# Patient Record
Sex: Male | Born: 1990 | Hispanic: No | Marital: Single | Smoking: Never smoker
Health system: Southern US, Community
[De-identification: ages and names within clinical notes are randomized; demographics above are authoritative.]

## PROBLEM LIST (undated history)

## (undated) DIAGNOSIS — F4310 Post-traumatic stress disorder, unspecified: Secondary | ICD-10-CM

---
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
## 2008-01-02 ENCOUNTER — Ambulatory Visit: Payer: Self-pay | Admitting: Family Medicine

## 2008-01-02 DIAGNOSIS — K219 Gastro-esophageal reflux disease without esophagitis: Secondary | ICD-10-CM

## 2009-02-01 ENCOUNTER — Emergency Department (HOSPITAL_COMMUNITY): Admission: EM | Admit: 2009-02-01 | Discharge: 2009-02-02 | Payer: Self-pay | Admitting: Emergency Medicine

## 2010-02-23 ENCOUNTER — Emergency Department (HOSPITAL_COMMUNITY): Admission: EM | Admit: 2010-02-23 | Discharge: 2010-02-23 | Payer: Self-pay | Admitting: Emergency Medicine

## 2010-02-24 ENCOUNTER — Ambulatory Visit: Payer: Self-pay | Admitting: Psychiatry

## 2010-02-24 ENCOUNTER — Inpatient Hospital Stay (HOSPITAL_COMMUNITY): Admission: AD | Admit: 2010-02-24 | Discharge: 2010-03-09 | Payer: Self-pay | Admitting: Psychiatry

## 2011-01-21 NOTE — Letter (Signed)
Summary: WELLNESS ASSESSMENT  WELLNESS ASSESSMENT   Imported By: Arta Bruce 11/19/2010 16:50:18  _____________________________________________________________________  External Attachment:    Type:   Image     Comment:   External Document

## 2011-03-14 LAB — DIFFERENTIAL
Basophils Relative: 1 % (ref 0–1)
Eosinophils Absolute: 0.1 10*3/uL (ref 0.0–0.7)
Eosinophils Relative: 1 % (ref 0–5)
Lymphs Abs: 1.4 10*3/uL (ref 0.7–4.0)
Monocytes Absolute: 0.5 10*3/uL (ref 0.1–1.0)
Monocytes Relative: 7 % (ref 3–12)
Neutrophils Relative %: 71 % (ref 43–77)

## 2011-03-14 LAB — COMPREHENSIVE METABOLIC PANEL
ALT: 47 U/L (ref 0–53)
Albumin: 5 g/dL (ref 3.5–5.2)
BUN: 19 mg/dL (ref 6–23)
CO2: 26 mEq/L (ref 19–32)
Calcium: 9.9 mg/dL (ref 8.4–10.5)
Chloride: 104 mEq/L (ref 96–112)
GFR calc non Af Amer: >60 mL/min (ref >60)
Glucose, Bld: 103 mg/dL — ABNORMAL HIGH (ref 70–99)
Potassium: 4.5 mEq/L (ref 3.5–5.1)
Total Protein: 8.4 g/dL — ABNORMAL HIGH (ref 6.0–8.3)

## 2011-03-14 LAB — CBC
Hemoglobin: 16 g/dL (ref 13.0–17.0)
RBC: 4.77 MIL/uL (ref 4.22–5.81)
WBC: 7.2 10*3/uL (ref 4.0–10.5)

## 2011-03-14 LAB — TSH: TSH: 0.499 u[IU]/mL — ABNORMAL LOW (ref 0.700–6.400)

## 2011-03-14 LAB — RAPID URINE DRUG SCREEN, HOSP PERFORMED
Benzodiazepines: NOT DETECTED
Tetrahydrocannabinol: NOT DETECTED

## 2011-04-05 IMAGING — CT CT HEAD W/O CM
1 of 2 series · 13 of 30 positions shown, 17 images · non-contrast
Comparison: 02/10/2006.

CLINICAL DATA: A LOC

CT HEAD WITHOUT CONTRAST
TECHNIQUE: Contiguous axial images were obtained from the base of
the skull through the vertex without contrast.

[Series 2: brain · axial · 0.47mm/px · z∈[+164,+289]mm · 13 of 28 slices shown, 17 images]
[im 2/28  brain]
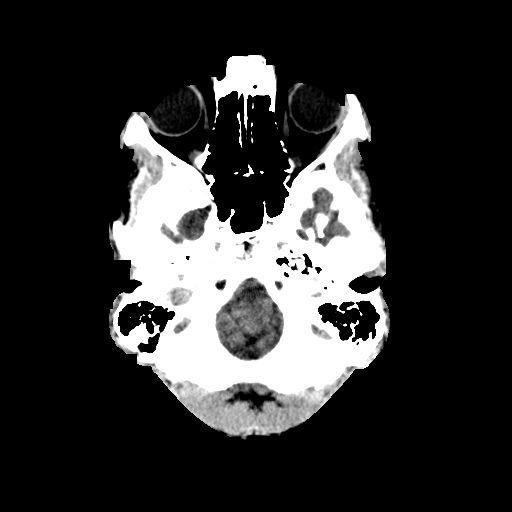
[im 2/28  bone]
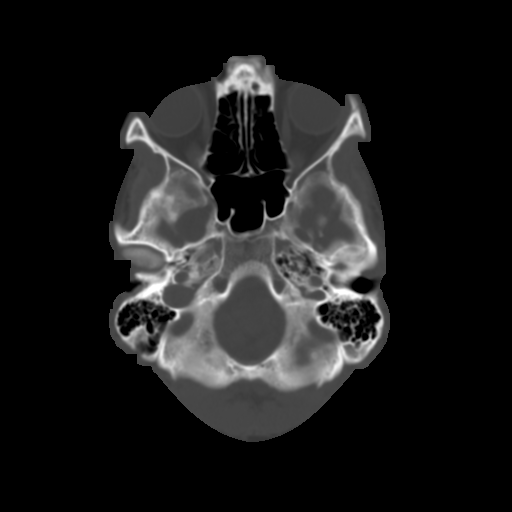
[im 4/28  brain]
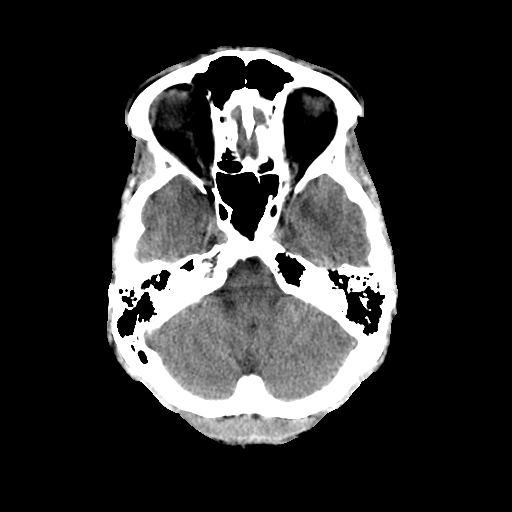
[im 6/28  brain]
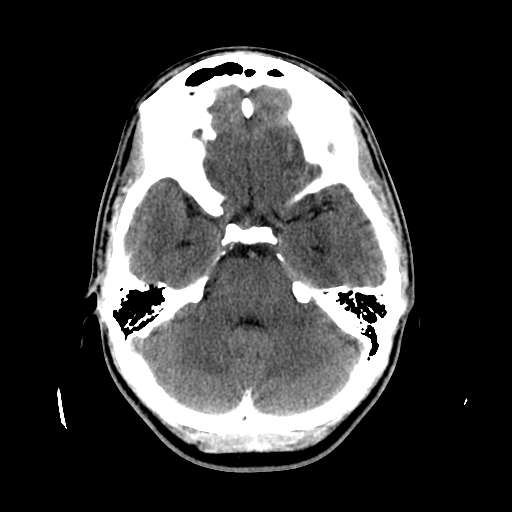
[im 8/28  brain]
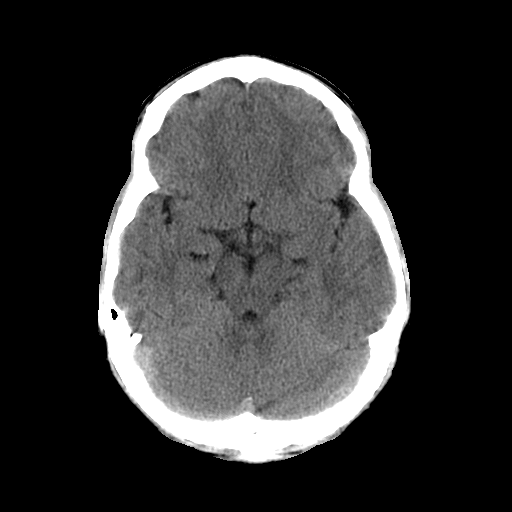
[im 10/28  brain]
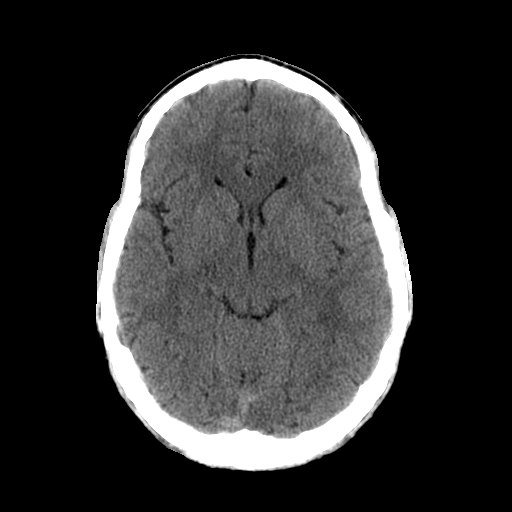
[im 10/28  bone]
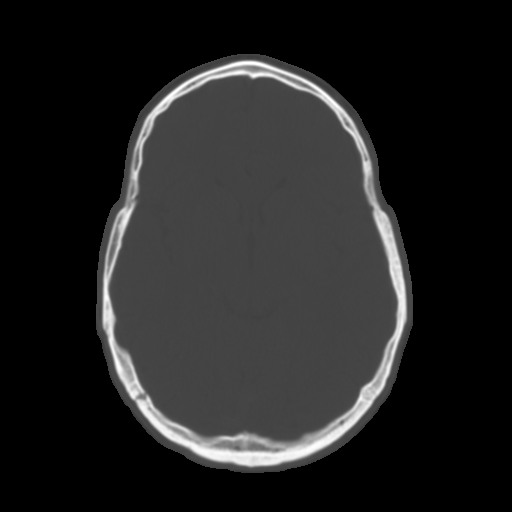
[im 12/28  brain]
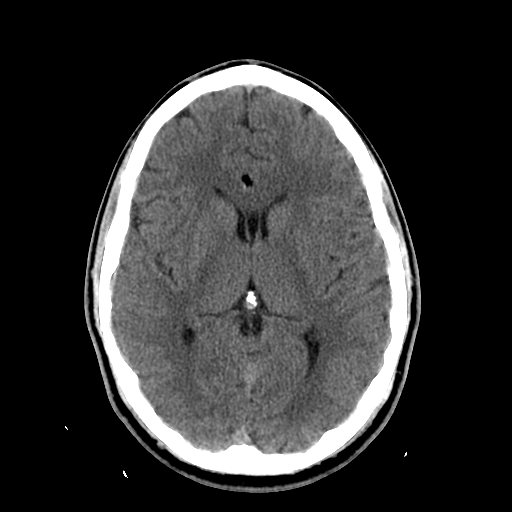
[im 14/28  brain]
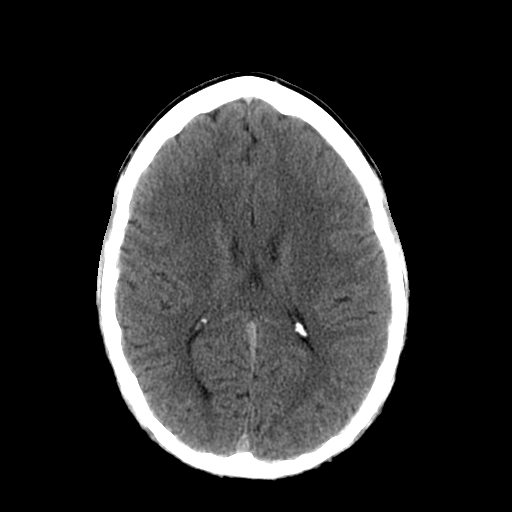
[im 16/28  brain]
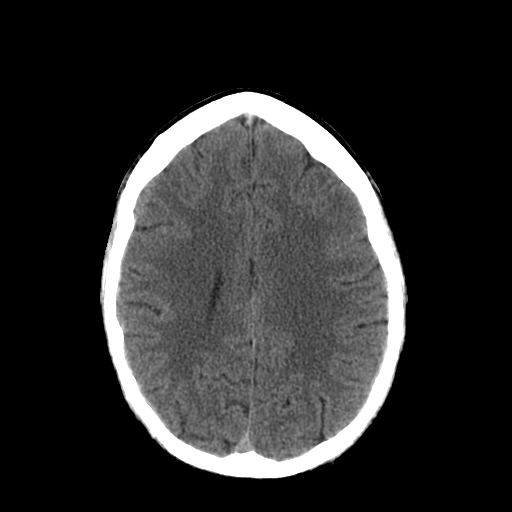
[im 18/28  brain]
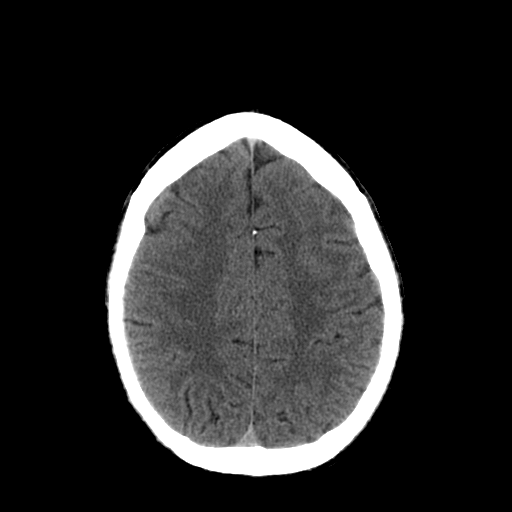
[im 18/28  bone]
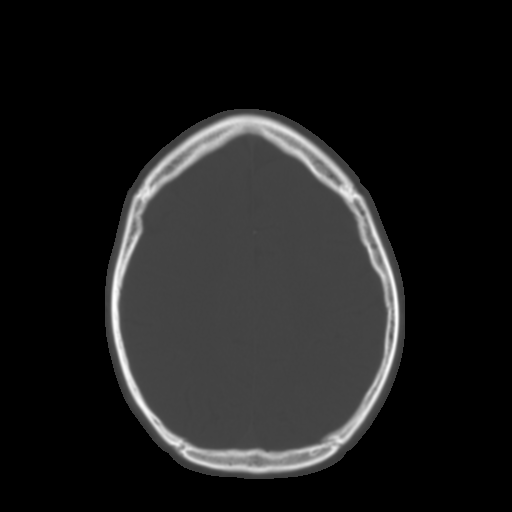
[im 20/28  brain]
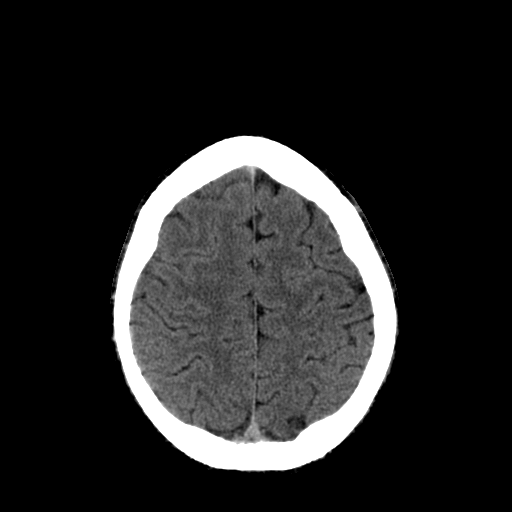
[im 22/28  brain]
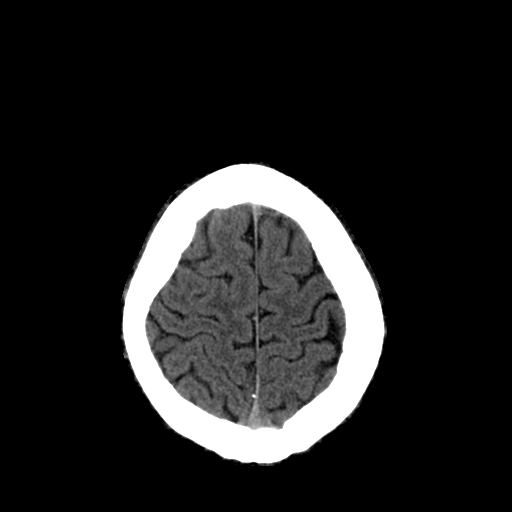
[im 24/28  brain]
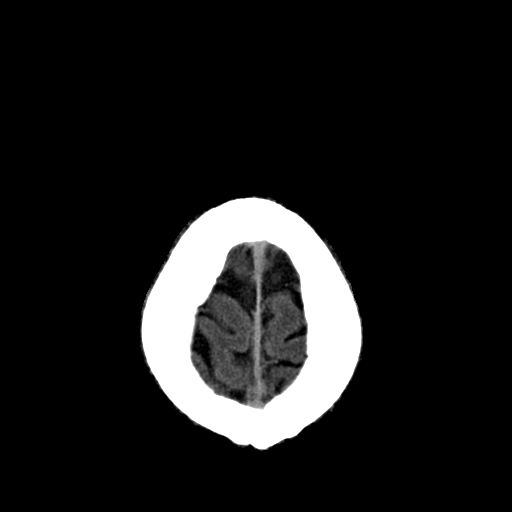
[im 26/28  brain]
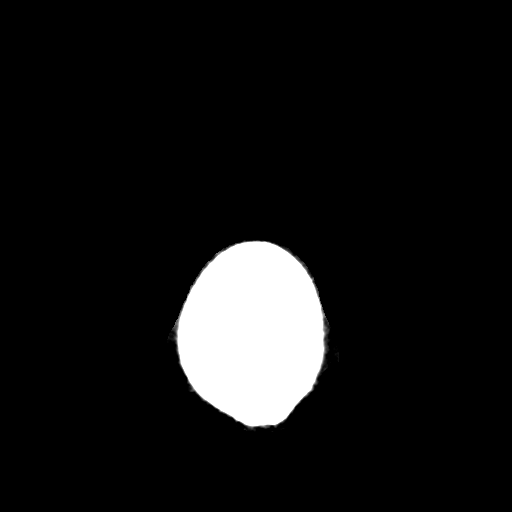
[im 26/28  bone]
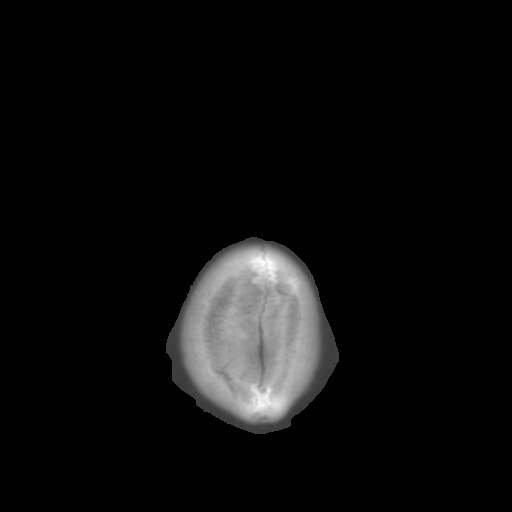

[13 of 30 positions shown; findings below may reference images not displayed]

FINDINGS: Small anterior interhemispheric lipoma is again noted.
This may not be a clinically significant finding.

No acute findings.  Negative for hemorrhage, acute infarction, or
mass lesion.  Bony calvarium intact.  Paranasal and mastoid sinuses
are aerated.
IMPRESSION: No acute intracranial abnormality.  Stable small interhemispheric
lipoma.

## 2011-10-04 LAB — POCT RAPID STREP A: Streptococcus, Group A Screen (Direct): NEGATIVE

## 2012-09-23 ENCOUNTER — Telehealth (HOSPITAL_COMMUNITY): Payer: Self-pay | Admitting: Emergency Medicine

## 2012-09-23 ENCOUNTER — Emergency Department (INDEPENDENT_AMBULATORY_CARE_PROVIDER_SITE_OTHER)
Admission: EM | Admit: 2012-09-23 | Discharge: 2012-09-23 | Disposition: A | Discharge status: Home / Self Care | Payer: Self-pay | Source: Home / Self Care | Attending: Emergency Medicine | Admitting: Emergency Medicine

## 2012-09-23 ENCOUNTER — Encounter (HOSPITAL_COMMUNITY): Payer: Self-pay | Admitting: Emergency Medicine

## 2012-09-23 DIAGNOSIS — K645 Perianal venous thrombosis: Secondary | ICD-10-CM

## 2012-09-23 MED ORDER — LIDOCAINE-HYDROCORTISONE ACE 3-1 % EX PADS: MEDICATED_PAD | CUTANEOUS | Status: AC

## 2012-09-23 MED ORDER — LIDOCAINE-HYDROCORTISONE ACE 3-1 % EX PADS: MEDICATED_PAD | CUTANEOUS | Status: DC

## 2012-09-23 NOTE — ED Provider Notes (Signed)
History     CSN: [id number]  Arrival date & time 09/23/12  1055   First MD Initiated Contact with Patient 09/23/12 1144      Chief Complaint  Patient presents with  . Back Pain  . Hemorrhoids    (Consider location/radiation/quality/duration/timing/severity/associated sxs/prior treatment) HPI Comments: For about 2 days patient has been experiencing pain in his rectal area has not seen any blood or discharge. 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 He feels a little bump in his rectal area that is tender at touch. Denies any bleeding, fevers chills or abdominal pain he denies having had any hemorrhoids in the past. He does describe that recently he had been in another clinic will he had multiple blood in a rectal swab test screening him for different sexually transmitted illnesses he describes. Patient denies that he was symptomatic at that time but wanted to be screened for many things.? Patient denies any dysphoria, paresthesias or weakness of his lower extremities  Patient is a 21 y.o. male presenting with back pain. The history is provided by the patient.  Back Pain  This is a new problem. The current episode started 2 days ago. The problem occurs constantly. The problem has been gradually worsening. The pain is mild. Pertinent negatives include no fever, no numbness, no abdominal pain, no dysuria, no paresthesias, no tingling and no weakness. Treatments tried: Used some bath soaks and medicated wipes.    History reviewed. No pertinent past medical history.  History reviewed. No pertinent past surgical history.  History reviewed. No pertinent family history.  History  Substance Use Topics  . Smoking status: Never Smoker   . Smokeless tobacco: Not on file  . Alcohol Use: No      Review of Systems  Constitutional: Negative for fever, chills, diaphoresis, activity change and appetite change.  Gastrointestinal:  Positive for rectal pain. Negative for nausea, vomiting, abdominal pain, diarrhea, constipation, blood in stool, abdominal distention and anal bleeding.  Genitourinary: Negative for dysuria, frequency and flank pain.  Musculoskeletal: Positive for back pain. Negative for joint swelling.  Skin: Negative for rash and wound.  Neurological: Negative for tingling, weakness, numbness and paresthesias.    Allergies  Review of patient's allergies indicates no known allergies.  Home Medications   Current Outpatient Rx  Name Route Sig Dispense Refill  . LIDOCAINE-HYDROCORTISONE ACE 3-1 % EX PADS  A[pply pads twice a day 30 each 0    BP 145/84  Pulse 70  Temp 99.2 F (37.3 C) (Oral)  Resp 17  SpO2 100%  Physical Exam  Nursing note and vitals reviewed. Constitutional: He appears well-developed and well-nourished.  Abdominal: Soft. Bowel sounds are normal.  Genitourinary: Penis normal. Rectal exam shows external hemorrhoid.     Neurological: He is alert.  Skin: No rash noted. No erythema.   Procedure note: Patient was anesthetized using lidocaine with epinephrine 2% about 4 cc were injected into the thrombosed external hemorrhoids after that area was explored prior to him cross-section incision and removal of blood clot. Post removal no immediate bleeding is noted. ED Course  Procedures (including critical care time)  Labs Reviewed - No data to display No results found.   1. Thrombosed external hemorrhoids       MDM  Thrombosed external hemorrhoid. Thrombosed was excised and removed. Patient prescribed lidocaine with hydrocortisone topical call we have provided a work note to be off his job for  5 days. We discuss symptoms that should warrant his return.        Jimmie Molly, MD 09/23/12 1539

## 2012-09-23 NOTE — ED Notes (Signed)
Pt c/o hem being inflamed and lower back pain since 09/20/2012. Pt states that he does a lot of heavy lifting at work. Pt has tried bath soaks and medicated wipes with no relief of pain.  Pt states that he is unable to eat because of the pain with using the restroom. Pt denies bleeding.

## 2012-09-23 NOTE — ED Notes (Signed)
Pharmacy called stating that they did not have the RX as written.  They stated they had the 3-.5% in a kit.  Dr Ladon Applebaum reviewed chart and stated it could be changed and directions should remain the same.  Shiny at [facility] pharmacy notified

## 2012-09-25 ENCOUNTER — Telehealth (HOSPITAL_COMMUNITY): Payer: Self-pay | Admitting: *Deleted

## 2012-09-25 NOTE — ED Notes (Signed)
Pt. Came to [facility] saying he needed paperwork filled out. I talked to pt.'s supervisor Jethro Bastos, from the [employer] in [place].  She said he needs paperwork filled out for the restrictions at work. She will fax them. I gave her the fax number  Fax received. Pt. Asked to fill out his portion. Unable to find paperwork after pt. left. I called but his phone number was not in service. I called Mom and left a message.  Pt.'s Mom called back. I told her I needed to talk to her son.  His phone number did not work. She gave me [phone] as his new number.  He was there with her. He said he took his papers home with him but will bring them back in the morning. Vassie Moselle 09/25/2012

## 2012-10-09 ENCOUNTER — Telehealth (HOSPITAL_COMMUNITY): Payer: Self-pay | Admitting: *Deleted

## 2012-10-09 NOTE — ED Notes (Signed)
Pt. called and said he needs a note to return to work, with the restrictions lifted. Discussed with Dr. Ladon Applebaum and he said if pt. has rectal pain or bleeding he needs to come back and be rechecked. If not, I can do the note for him to return to work. I  told him what the doctor said.  He said he does not have those symptoms.  I told him I would do the note and leave it at the front desk. He asked if I could fax it and I said I could not. He said he would come to pick it up. Note done and put at the front desk Ricardo Mann 10/09/2012

## 2012-10-21 ENCOUNTER — Emergency Department (HOSPITAL_COMMUNITY)
Admission: EM | Admit: 2012-10-21 | Discharge: 2012-10-23 | Disposition: A | Discharge status: Other Acute Inpatient Hospital | Payer: Self-pay | Attending: Emergency Medicine | Admitting: Emergency Medicine

## 2012-10-21 ENCOUNTER — Encounter (HOSPITAL_COMMUNITY): Payer: Self-pay | Admitting: Emergency Medicine

## 2012-10-21 DIAGNOSIS — F4310 Post-traumatic stress disorder, unspecified: Secondary | ICD-10-CM | POA: Insufficient documentation

## 2012-10-21 DIAGNOSIS — F29 Unspecified psychosis not due to a substance or known physiological condition: Secondary | ICD-10-CM | POA: Insufficient documentation

## 2012-10-21 HISTORY — DX: Post-traumatic stress disorder, unspecified: F43.10

## 2012-10-21 LAB — ACETAMINOPHEN LEVEL: Acetaminophen (Tylenol), Serum: <15 ug/mL (ref 10–30)

## 2012-10-21 LAB — SALICYLATE LEVEL: Salicylate Lvl: <2 mg/dL — ABNORMAL LOW (ref 2.8–20.0)

## 2012-10-21 LAB — COMPREHENSIVE METABOLIC PANEL
ALT: 19 U/L (ref 0–53)
Alkaline Phosphatase: 55 U/L (ref 39–117)
CO2: 26 mEq/L (ref 19–32)
Calcium: 9.1 mg/dL (ref 8.4–10.5)
Chloride: 101 mEq/L (ref 96–112)
Creatinine, Ser: 0.71 mg/dL (ref 0.50–1.35)
GFR calc Af Amer: >90 mL/min (ref >90)
GFR calc non Af Amer: >90 mL/min (ref >90)
Glucose, Bld: 102 mg/dL — ABNORMAL HIGH (ref 70–99)

## 2012-10-21 LAB — RAPID URINE DRUG SCREEN, HOSP PERFORMED
Barbiturates: NOT DETECTED
Cocaine: NOT DETECTED

## 2012-10-21 LAB — CBC
Hemoglobin: 14.9 g/dL (ref 13.0–17.0)
RDW: 12.3 % (ref 11.5–15.5)
WBC: 8.2 10*3/uL (ref 4.0–10.5)

## 2012-10-21 LAB — ETHANOL: Alcohol, Ethyl (B): <11 mg/dL (ref 0–11)

## 2012-10-21 MED ORDER — DIPHENHYDRAMINE HCL 25 MG PO CAPS: 50.0000 mg | ORAL_CAPSULE | Freq: Every evening | ORAL | Status: DC | PRN

## 2012-10-21 MED ORDER — DIVALPROEX SODIUM 500 MG PO DR TAB
500.0000 mg | DELAYED_RELEASE_TABLET | Freq: Two times a day (BID) | ORAL | Status: DC
  Administered 2012-10-22 – 2012-10-23 (×4): 500 mg via ORAL
  Filled 2012-10-21 (×4): qty 1

## 2012-10-21 MED ORDER — BENZTROPINE MESYLATE 1 MG/ML IJ SOLN: 2.0000 mg | Freq: Two times a day (BID) | INTRAMUSCULAR | Status: DC | PRN

## 2012-10-21 MED ORDER — HALOPERIDOL 5 MG PO TABS
5.0000 mg | ORAL_TABLET | Freq: Two times a day (BID) | ORAL | Status: DC
  Administered 2012-10-22 – 2012-10-23 (×3): 5 mg via ORAL
  Filled 2012-10-21 (×3): qty 1

## 2012-10-21 MED ORDER — HALOPERIDOL LACTATE 5 MG/ML IJ SOLN: 5.0000 mg | Freq: Three times a day (TID) | INTRAMUSCULAR | Status: DC | PRN

## 2012-10-21 MED ORDER — LORAZEPAM 2 MG/ML IJ SOLN: 2.0000 mg | Freq: Three times a day (TID) | INTRAMUSCULAR | Status: DC | PRN

## 2012-10-21 MED ORDER — LORAZEPAM 1 MG PO TABS
2.0000 mg | ORAL_TABLET | Freq: Once | ORAL | Status: AC
  Administered 2012-10-21: 2 mg via ORAL
  Filled 2012-10-21: qty 2

## 2012-10-21 MED ORDER — CLONAZEPAM 0.5 MG PO TABS
2.0000 mg | ORAL_TABLET | Freq: Two times a day (BID) | ORAL | Status: DC
  Administered 2012-10-22 – 2012-10-23 (×3): 2 mg via ORAL
  Filled 2012-10-21 (×2): qty 2
  Filled 2012-10-21: qty 1
  Filled 2012-10-21 (×2): qty 2

## 2012-10-21 MED ORDER — WHITE PETROLATUM GEL
Status: AC
  Administered 2012-10-21: 1
  Filled 2012-10-21: qty 5

## 2012-10-21 MED ORDER — HALOPERIDOL LACTATE 5 MG/ML IJ SOLN
10.0000 mg | Freq: Once | INTRAMUSCULAR | Status: AC
  Administered 2012-10-21: 10 mg via INTRAMUSCULAR
  Filled 2012-10-21: qty 2

## 2012-10-21 NOTE — ED Notes (Signed)
Ricardo Mann (Pt's mother) [phone]

## 2012-10-21 NOTE — ED Notes (Signed)
Pt sister Lennex Rohm can be reached at [phone] for any questions.

## 2012-10-21 NOTE — ED Notes (Signed)
Pt given sandwich, chips and drinks

## 2012-10-21 NOTE — ED Notes (Signed)
Per EMS: Pt from home.  Family called.  Pt not acting himself.  Pt having flight of ideas and is talking about mariah carey, colors, water, climbing trees, etc.  Hx of PTSD.  Unknown from what.  Family called this a mental breakdown.

## 2012-10-21 NOTE — ED Notes (Signed)
Pt placed in room 43 and placed in blue scrubs.  Pt's clothing removed from room and wanded by security.  Noted one t shirt, one pair of pants, one set of underwear, one pair of shoes.  4 earrings placed in biohazard bag and in pt's belongings bag.  Pt's belongings placed in locker #43.

## 2012-10-21 NOTE — ED Notes (Signed)
Pt's sister states that he rode the bus from [place] to [place] unannounced and arrived at sister's house at 0600 on Friday.  She told him to go back home.  When she went to see him today, he had had a "mental breakdown".  Pt is talking continuously.  None of what he is saying makes sense.  Tangential thoughts.

## 2012-10-21 NOTE — ED Notes (Signed)
Up in room, pt pulled code blue button and was instructed not to pull it-pt verbalized understanding, but had pulled it to have the TV fixed.  TV adjusted, pt pleasant, but rambling, requesting a finger nail file, down loads of albums, see thru under wear.

## 2012-10-21 NOTE — ED Notes (Signed)
Dr bednar into see 

## 2012-10-22 NOTE — ED Notes (Signed)
Pt requested that meds be crushed and put in applesauce.

## 2012-10-22 NOTE — ED Notes (Signed)
Pt was changing positions but still sleeping.

## 2012-10-22 NOTE — ED Notes (Signed)
Patient currently asleep; no s/s of distress noted. Respirations regular and unlabored. Q 15 minute rounds maintained for safety.

## 2012-10-22 NOTE — ED Provider Notes (Signed)
History     CSN: [id number]  Arrival date & time 10/21/12  1443   First MD Initiated Contact with Patient 10/21/12 1530      Chief Complaint  Patient presents with  . Psychiatric Evaluation    (Consider location/radiation/quality/duration/timing/severity/associated sxs/prior treatment) HPI 21yo male, PMH psychosis, presents with anxiety, flight of ideas, pressured speech, nonsensical answers to many questions, unknown duration, homeless, no meds, no doctor, no treatment PTA, denies SI/HI/hallucinations. Past Medical History  Diagnosis Date  . PTSD (post-traumatic stress disorder)     No past surgical history on file.  No family history on file.  History  Substance Use Topics  . Smoking status: Never Smoker   . Smokeless tobacco: Not on file  . Alcohol Use: No      Review of Systems  Unable to perform ROS: Mental status change    Allergies  Review of patient's allergies indicates no known allergies.  Home Medications   Current Outpatient Rx  Name  Route  Sig  Dispense  Refill  . LIDOCAINE-HYDROCORTISONE ACE 3-1 % EX PADS      A[pply pads twice a day   30 each   0     BP 116/67  Pulse 83  Temp 97.9 F (36.6 C) (Oral)  Resp 20  SpO2 98%  Physical Exam  Nursing note and vitals reviewed. Constitutional: He is oriented to person, place, and time.       Awake, alert, nontoxic appearance with baseline speech for patient.  HENT:  Head: Atraumatic.  Mouth/Throat: No oropharyngeal exudate.  Eyes: EOM are normal. Pupils are equal, round, and reactive to light. Right eye exhibits no discharge. Left eye exhibits no discharge.  Neck: Neck supple.  Cardiovascular: Normal rate and regular rhythm.   No murmur heard. Pulmonary/Chest: Effort normal and breath sounds normal. No stridor. No respiratory distress. He has no wheezes. He has no rales. He exhibits no tenderness.  Abdominal: Soft. Bowel sounds are normal. He exhibits no mass. There is no tenderness. There  is no rebound.  Musculoskeletal: He exhibits no tenderness.       Baseline ROM, moves extremities with no obvious new focal weakness.  Lymphadenopathy:    He has no cervical adenopathy.  Neurological: He is alert and oriented to person, place, and time.       Awake, alert, cooperative and aware of situation; motor strength bilaterally; sensation normal to light touch bilaterally; peripheral visual fields full to confrontation; no facial asymmetry; tongue midline; major cranial nerves appear intact; no pronator drift, normal finger to nose bilaterally, baseline gait without new ataxia.  Skin: No rash noted.  Psychiatric:       Anxious, agitated, pressured speech, flight of ideas, lacks insight/judgment    ED Course  Procedures (including critical care time) Made IVC; Telepsych agrees.  Pt became agitated with uncontrolled behavior in ED required emergent treatment of his psychosis.  CRITICAL CARE Performed by: Hurman Horn   Total critical care time:  Critical care time was exclusive of separately billable procedures and treating other patients.  Critical care was necessary to treat or prevent imminent or life-threatening deterioration.  Critical care was time spent personally by me on the following activities: development of treatment plan with patient and/or surrogate as well as nursing, discussions with consultants, evaluation of patient's response to treatment, examination of patient, obtaining history from patient or surrogate, ordering and performing treatments and interventions, ordering and review of laboratory studies, ordering and review of radiographic studies, pulse  oximetry and re-evaluation of patient's condition. Labs Reviewed  COMPREHENSIVE METABOLIC PANEL - Abnormal; Notable for the following:    Glucose, Bld 102 (*)     All other components within normal limits  SALICYLATE LEVEL - Abnormal; Notable for the following:    Salicylate Lvl <2.0 (*)     All other  components within normal limits  CBC  ETHANOL  URINE RAPID DRUG SCREEN (HOSP PERFORMED)  ACETAMINOPHEN LEVEL   No results found.   1. Psychosis       MDM  Dispo pnd.        Hurman Horn, MD 10/22/12 1710
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
## 2012-10-22 NOTE — BHH Counselor (Signed)
Patient has been accepted at [facility] pending bed availability by Jorje Guild PA.

## 2012-10-22 NOTE — ED Notes (Signed)
Pt extremely manic, orders received for IM Haldol, security guards and [employer] in room for assistance.  Gave Haldol and pt took po ativan. Will continue to monitor.  Barnett Hatter P

## 2012-10-23 LAB — VALPROIC ACID LEVEL: Valproic Acid Lvl: 66.5 ug/mL (ref 50.0–100.0)

## 2012-10-23 NOTE — Progress Notes (Signed)
Pt speaking with CM on various topics no aggressive behavior Pleasant cordial

## 2012-10-23 NOTE — Progress Notes (Signed)
Sitting with pt Completed meal

## 2012-10-23 NOTE — ED Notes (Signed)
Pt. Moved to TCU room 29.  Pt. Calm, no distress noted.

## 2012-10-23 NOTE — ED Notes (Signed)
Resting with eyes closed.  Sitter at door.

## 2012-10-23 NOTE — BHH Counselor (Signed)
Pt also referred to [facility] and accepted by Dr. Jeannine Kitten. Nurse will need to call report to [phone]. Pt placed under IVC and [employer] will need to transport patient to [facility].

## 2012-10-23 NOTE — ED Notes (Signed)
Patient out of bed, unsteady needing two person assist. Pt urinating in urinal. Pt cooperative with staff, easily redirected.

## 2012-10-23 NOTE — ED Notes (Signed)
Patient asleep, no s/s of distress noted; respirations regular and unlabored. Q 15 minute rounds maintained.

## 2012-10-23 NOTE — ED Notes (Signed)
Pt. Given 2 warm blankets, per request.

## 2012-10-23 NOTE — ED Notes (Signed)
Pt transported via [employer] to [facility].

## 2012-10-23 NOTE — ED Provider Notes (Addendum)
Ricardo Mann is a 21 y.o. male here with psychosis. He is under IVC order. He was agitated and was given ativan and haldol. This AM, he was calm and had no complaints. He is accepted at [facility] now pending a bed. He is on depakote and will check level this AM prior to his next dose.    [name], MD 10/23/12 0731  9:57 AM Depakote level appropriate. Will continue same dose of depakote.   [name], MD 10/23/12 0957  3:55 PM Patient accepted at [facility] regional under Dr. Maralyn Sago. Medically stable.   [name], MD 10/23/12 1556

## 2012-10-23 NOTE — Progress Notes (Signed)
Pt to bathroom to vd and to have a "Booboo" Pt presently lying in bed with eyes closed sleeping No distress noted

## 2012-10-23 NOTE — ED Notes (Signed)
Patient remains asleep; no distress noted at this time. Respirations regular and unlabored.

## 2012-10-23 NOTE — ED Notes (Signed)
No change in patient's condition during shift. No distress noted at this time.

## 2022-10-24 ENCOUNTER — Emergency Department (HOSPITAL_COMMUNITY): Payer: No Typology Code available for payment source

## 2022-10-24 ENCOUNTER — Other Ambulatory Visit: Payer: Self-pay

## 2022-10-24 ENCOUNTER — Encounter (HOSPITAL_COMMUNITY): Payer: Self-pay | Admitting: Emergency Medicine

## 2022-10-24 ENCOUNTER — Emergency Department (HOSPITAL_COMMUNITY)
Admission: EM | Admit: 2022-10-24 | Discharge: 2022-10-24 | Disposition: A | Discharge status: Home / Self Care | Payer: No Typology Code available for payment source | Attending: Emergency Medicine | Admitting: Emergency Medicine

## 2022-10-24 DIAGNOSIS — F1090 Alcohol use, unspecified, uncomplicated: Secondary | ICD-10-CM | POA: Insufficient documentation

## 2022-10-24 DIAGNOSIS — Y907 Blood alcohol level of 200-239 mg/100 ml: Secondary | ICD-10-CM | POA: Insufficient documentation

## 2022-10-24 DIAGNOSIS — F10920 Alcohol use, unspecified with intoxication, uncomplicated: Secondary | ICD-10-CM

## 2022-10-24 DIAGNOSIS — R4182 Altered mental status, unspecified: Secondary | ICD-10-CM | POA: Diagnosis present

## 2022-10-24 LAB — COMPREHENSIVE METABOLIC PANEL
ALT: 16 U/L (ref 0–44)
AST: 20 U/L (ref 15–41)
Albumin: 4.8 g/dL (ref 3.5–5.0)
Alkaline Phosphatase: 56 U/L (ref 38–126)
Anion gap: 13 (ref 5–15)
BUN: 8 mg/dL (ref 6–20)
CO2: 23 mmol/L (ref 22–32)
Calcium: 9 mg/dL (ref 8.9–10.3)
Chloride: 104 mmol/L (ref 98–111)
Creatinine, Ser: 0.62 mg/dL (ref 0.61–1.24)
GFR, Estimated: >60 mL/min (ref >60)
Glucose, Bld: 106 mg/dL — ABNORMAL HIGH (ref 70–99)
Potassium: 3.4 mmol/L — ABNORMAL LOW (ref 3.5–5.1)
Sodium: 140 mmol/L (ref 135–145)
Total Bilirubin: 0.5 mg/dL (ref 0.3–1.2)
Total Protein: 8 g/dL (ref 6.5–8.1)

## 2022-10-24 LAB — CBC WITH DIFFERENTIAL/PLATELET
Abs Immature Granulocytes: 0.01 10*3/uL (ref 0.00–0.07)
Basophils Absolute: 0.1 10*3/uL (ref 0.0–0.1)
Basophils Relative: 1 %
Eosinophils Absolute: 0.3 10*3/uL (ref 0.0–0.5)
Eosinophils Relative: 4 %
HCT: 51.4 % (ref 39.0–52.0)
Hemoglobin: 17.3 g/dL — ABNORMAL HIGH (ref 13.0–17.0)
Immature Granulocytes: 0 %
Lymphocytes Relative: 32 %
Lymphs Abs: 2.4 10*3/uL (ref 0.7–4.0)
MCH: 31.4 pg (ref 26.0–34.0)
MCHC: 33.7 g/dL (ref 30.0–36.0)
MCV: 93.3 fL (ref 80.0–100.0)
Monocytes Absolute: 0.5 10*3/uL (ref 0.1–1.0)
Monocytes Relative: 7 %
Neutro Abs: 4.2 10*3/uL (ref 1.7–7.7)
Neutrophils Relative %: 56 %
Platelets: 212 10*3/uL (ref 150–400)
RBC: 5.51 MIL/uL (ref 4.22–5.81)
RDW: 12.6 % (ref 11.5–15.5)
WBC: 7.4 10*3/uL (ref 4.0–10.5)
nRBC: 0 % (ref 0.0–0.2)

## 2022-10-24 LAB — ETHANOL: Alcohol, Ethyl (B): 204 mg/dL — ABNORMAL HIGH (ref <10)

## 2022-10-24 LAB — MAGNESIUM: Magnesium: 2.4 mg/dL (ref 1.7–2.4)

## 2022-10-24 MED ORDER — LACTATED RINGERS IV BOLUS
1000.0000 mL | Freq: Once | INTRAVENOUS | Status: AC
  Administered 2022-10-24: 1000 mL via INTRAVENOUS

## 2022-10-24 NOTE — ED Provider Notes (Cosign Needed Addendum)
[facility]-EMERGENCY DEPT Provider Note   CSN: [id number] Arrival date & time: 10/24/22  0248     History Chief Complaint  Patient presents with   Alcohol Intoxication    Ricardo Mann is a 31 y.o. male with history of psychosis presents the emergency room and for evaluation after being found down outside [employer].  Brought in by [employer].  According to the triage note, patient states that he is hanging out with some friends that "drink way too much" but he denies any personal alcohol use to himself.  Patient did admit to me that he only had 3 drinks and some marijuana.  According to triage note, he denies any alcohol or drug use.  He has no complaints of any head or neck pain.  Denies any chest pain, shortness breath, abdominal pain, nausea, vomiting.  He does report of being cold, but feels better now.  When asked why the patient was here today, the patient did not know.  He reports that he was drinking.  Does appear slightly intoxicated, however patient is alert and answering questions appropriately and he is oriented x3.   Alcohol Intoxication Pertinent negatives include no chest pain, no abdominal pain, no headaches and no shortness of breath.       Home Medications Prior to Admission medications   Medication Sig Start Date End Date Taking? Authorizing Provider  Lidocaine-Hydrocortisone Ace 3-1 % PADS A[pply pads twice a day 09/23/12   Jimmie Molly, MD      Allergies    Patient has no known allergies.    Review of Systems   Review of Systems  Constitutional:  Negative for chills.  Respiratory:  Negative for shortness of breath.   Cardiovascular:  Negative for chest pain.  Gastrointestinal:  Negative for abdominal pain.  Musculoskeletal:  Negative for back pain and neck pain.  Neurological:  Negative for headaches.    Physical Exam Updated Vital Signs BP 123/88 (BP Location: Left Arm)   Pulse 60   Temp 97.7 F (36.5 C) (Oral)   Resp 16   Ht 5' 5.5"  (1.664 m)   Wt 71.7 kg   SpO2 99%   BMI 25.89 kg/m  Physical Exam Vitals and nursing note reviewed.  Constitutional:      General: He is not in acute distress.    Appearance: Normal appearance. He is not ill-appearing or toxic-appearing.  HENT:     Head: Normocephalic and atraumatic.  Eyes:     General: No scleral icterus. Cardiovascular:     Rate and Rhythm: Normal rate and regular rhythm.  Pulmonary:     Effort: Pulmonary effort is normal.     Breath sounds: Normal breath sounds.  Abdominal:     General: Abdomen is flat. Bowel sounds are normal.     Palpations: Abdomen is soft.     Tenderness: There is no abdominal tenderness. There is no guarding or rebound.  Musculoskeletal:        General: No tenderness or deformity.     Cervical back: Normal range of motion. No tenderness.  Skin:    General: Skin is warm and dry.  Neurological:     General: No focal deficit present.     Mental Status: He is alert and oriented to person, place, and time. Mental status is at baseline.     ED Results / Procedures / Treatments   Labs (all labs ordered are listed, but only abnormal results are displayed) Labs Reviewed  ETHANOL -  Abnormal; Notable for the following components:      Result Value   Alcohol, Ethyl (B) 204 (*)    All other components within normal limits  CBC WITH DIFFERENTIAL/PLATELET - Abnormal; Notable for the following components:   Hemoglobin 17.3 (*)    All other components within normal limits  COMPREHENSIVE METABOLIC PANEL - Abnormal; Notable for the following components:   Potassium 3.4 (*)    Glucose, Bld 106 (*)    All other components within normal limits  MAGNESIUM  URINALYSIS, ROUTINE W REFLEX MICROSCOPIC  RAPID URINE DRUG SCREEN, HOSP PERFORMED    EKG None  Radiology CT Head Wo Contrast  Result Date: 10/24/2022 CLINICAL DATA:  Unknown head trauma, found down outdoors. EXAM: CT HEAD WITHOUT CONTRAST CT CERVICAL SPINE WITHOUT CONTRAST TECHNIQUE:  Multidetector CT imaging of the head and cervical spine was performed following the standard protocol without intravenous contrast. Multiplanar CT image reconstructions of the cervical spine were also generated. RADIATION DOSE REDUCTION: This exam was performed according to the departmental dose-optimization program which includes automated exposure control, adjustment of the mA and/or kV according to patient size and/or use of iterative reconstruction technique. COMPARISON:  Head CT 02/23/2010. FINDINGS: CT HEAD FINDINGS Brain: Motion artifact is noted on images through the vertex. Otherwise, no evidence of acute infarction, hemorrhage, hydrocephalus, extra-axial collection or mass lesion/mass effect. 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 There are persistent cavum septum pellucidum. Benign dural calcifications in the frontal falx. Vascular: No hyperdense central vessel. Skull: No fracture or focal lesion is seen. Sinuses/Orbits: Mastoid air cells and visualized sinuses are clear with again noted unilateral pneumatization left petrous apex. Other: There are wax impactions in both external auditory canals. CT CERVICAL SPINE FINDINGS Technical note: Cervical spine imaging is limited by motion artifact. A subtle fracture could be missed from the level of C3 through C5. Alignment: Normal. Skull base and vertebrae: There is normal bone mineralization. Allowing for motion no displaced fracture is suspected. The vertebra are normal in heights. Soft tissues and spinal canal: No prevertebral fluid or swelling. No visible canal hematoma. Disc levels:  No acute abnormality. Upper chest: No acute abnormality. Other: None. IMPRESSION: 1. Motion limited head CT near the vertex. 2. No acute intracranial CT findings or depressed skull fractures elsewhere. 3. Stable interhemispheric lipoma. 4. Wax impactions in both external auditory canals. 5. Cervical spine  imaging is limited by motion artifact. A subtle fracture could be missed from the level of C3 through C5. No displaced fractures are grossly suspected. If there is continued concern, obtain MRI or repeat the CT without motion. Electronically Signed   By: Almira Bar M.D.   On: 10/24/2022 06:55   CT Cervical Spine Wo Contrast  Result Date: 10/24/2022 CLINICAL DATA:  Unknown head trauma, found down outdoors. EXAM: CT HEAD WITHOUT CONTRAST CT CERVICAL SPINE WITHOUT CONTRAST TECHNIQUE: Multidetector CT imaging of the head and cervical spine was performed following the standard protocol without intravenous contrast. Multiplanar CT image reconstructions of the cervical spine were also generated. RADIATION DOSE REDUCTION: This exam was performed according to the departmental dose-optimization program which includes automated exposure control, adjustment of the mA and/or kV according to patient size and/or use of iterative reconstruction technique. COMPARISON:  Head CT 02/23/2010. FINDINGS: CT HEAD FINDINGS Brain: Motion artifact is noted on images through the vertex. Otherwise, no evidence of acute infarction, hemorrhage, hydrocephalus, extra-axial collection or mass lesion/mass effect. 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 There are persistent cavum septum pellucidum. Benign dural calcifications in the frontal falx. Vascular: No hyperdense central vessel. Skull: No fracture or focal lesion is seen. Sinuses/Orbits: Mastoid air cells and visualized sinuses are clear with again noted unilateral pneumatization left petrous apex. Other: There are wax impactions in both external auditory canals. CT CERVICAL SPINE FINDINGS Technical note: Cervical spine imaging is limited by motion artifact. A subtle fracture could be missed from the level of C3 through C5. Alignment: Normal. Skull base and vertebrae: There is normal bone mineralization. Allowing for  motion no displaced fracture is suspected. The vertebra are normal in heights. Soft tissues and spinal canal: No prevertebral fluid or swelling. No visible canal hematoma. Disc levels:  No acute abnormality. Upper chest: No acute abnormality. Other: None. IMPRESSION: 1. Motion limited head CT near the vertex. 2. No acute intracranial CT findings or depressed skull fractures elsewhere. 3. Stable interhemispheric lipoma. 4. Wax impactions in both external auditory canals. 5. Cervical spine imaging is limited by motion artifact. A subtle fracture could be missed from the level of C3 through C5. No displaced fractures are grossly suspected. If there is continued concern, obtain MRI or repeat the CT without motion. Electronically Signed   By: Almira Bar M.D.   On: 10/24/2022 06:55    Procedures Procedures   Medications Ordered in ED Medications  lactated ringers bolus 1,000 mL (0 mLs Intravenous Stopped 10/24/22 0518)    ED Course/ Medical Decision Making/ A&P Clinical Course as of 10/24/22 0730  Sun Oct 24, 2022  0708 CT Head Wo Contrast [RR]    Clinical Course User Index [RR] Achille Rich, PA-C                           Medical Decision Making Amount and/or Complexity of Data Reviewed Labs: ordered. Radiology: ordered. Decision-making details documented in ED Course.   31 year old male presents emerged department after being found outside [employer].  Differential diagnosis includes was limited to intoxication versus electrolyte abnormality versus psych.  Vital signs are unremarkable.  Physical exam as noted above.  Will order basic labs including ethanol and UDS.  Consider ordering CT head and neck given unknown patient had a traumatic fall or not. 1L LR ordered.  I independently reviewed and interpreted the patient's labs and imaging.  CBC shows slightly elevated hemoglobin at 17.3 otherwise no leukocytosis.  Could be in the setting of hemoconcentration from dehydration.  Magnesium within  normal limits.  CMP shows mild decrease potassium 3.4, mildly elevated glucose at 106 otherwise no LFT or electrolyte abnormality.  Ethanol significantly elevated at 204.  From reading nursing notes, patient has refused to provide a urine sample but has gone to the bathroom multiple times.  CT imaging shows 1. Motion limited head CT near the vertex. 2. No acute intracranial CT findings or depressed skull fractures elsewhere. 3. Stable interhemispheric lipoma. 4. Wax impactions in both external auditory canals. 5. Cervical spine imaging is limited by motion artifact. A subtle fracture could be missed from the level of C3 through C5. No displaced fractures are grossly suspected. If there is continued concern, obtain MRI or repeat the CT without motion..  On re-evaluation, patient awakes to voice.  He reports that he is feeling much better and would like to go home.  Patient appears clinically sober, he has been in the ER for almost 5  hours. We discussed return precautions and red flag symptoms.  Patient verbalizes understanding agrees the plan.  Patient is stable being discharged home in [place] condition..   Final Clinical Impression(s) / ED Diagnoses Final diagnoses:  Alcoholic intoxication without complication [facility])    Rx / DC Orders ED Discharge Orders     None         Sherrell Puller, PA-C 10/24/22 0732    Sherrell Puller, PA-C 10/24/22 [place], DO 10/26/22 1501

## 2022-10-24 NOTE — ED Notes (Signed)
  Patient ambulated to restroom and asked to obtain urine specimen.  Patient was instructed to give sample several times and would not urinate into the cup or urinal.

## 2022-10-24 NOTE — ED Notes (Signed)
Pt is able to stand from EMS stretcher and be seated on ED bed.

## 2022-10-24 NOTE — ED Triage Notes (Signed)
  Patient BIB [facility] for alcohol intoxication.  [facility] states patient was found laying down outside by [employer].  Patient states he was hanging out with some friends that "drink way too much" but denies any alcohol use.  Patient denies any drug use as well.  No complaints of pain, just cold.  Patient given warm blanket.
